# Patient Record
Sex: Female | Born: 2000 | Race: White | Hispanic: No | Marital: Single | Smoking: Never smoker
Health system: Southern US, Community
[De-identification: ages and names within clinical notes are randomized; demographics above are authoritative.]

## PROBLEM LIST (undated history)

## (undated) ENCOUNTER — Ambulatory Visit: Admission: EM | Source: Home / Self Care

## (undated) DIAGNOSIS — J45909 Unspecified asthma, uncomplicated: Secondary | ICD-10-CM

## (undated) DIAGNOSIS — F319 Bipolar disorder, unspecified: Secondary | ICD-10-CM

## (undated) HISTORY — PX: WISDOM TOOTH EXTRACTION: SHX21

## (undated) HISTORY — DX: Unspecified asthma, uncomplicated: J45.909

## (undated) HISTORY — DX: Bipolar disorder, unspecified: F31.9

## (undated) HISTORY — PX: TYMPANOSTOMY TUBE PLACEMENT: SHX32

---

## 2021-01-02 ENCOUNTER — Encounter: Payer: Self-pay | Admitting: *Deleted

## 2021-01-02 ENCOUNTER — Ambulatory Visit: Payer: 59

## 2021-01-02 ENCOUNTER — Other Ambulatory Visit: Payer: Self-pay | Admitting: *Deleted

## 2021-01-02 DIAGNOSIS — R002 Palpitations: Secondary | ICD-10-CM

## 2021-01-02 NOTE — Progress Notes (Signed)
Cardiology Office Note   Date:  01/03/2021   ID:  Berry, Gallacher 2000-05-28, MRN [id number]  PCP:  Wilfrid Lund, PA  Cardiologist:   Vilma Will [place], MD   Chief Complaint  Patient presents with   Palpitations      History of Present Illness: Dawn Snyder is a 20 y.o. female who is seen at the request of Horton Marshall PA for evaluation of tachycardia. She has a history of asthma and bipolar disorder. She uses an inhaler occasionally. Over the past 2 weeks she has noted that her heart rate will increase when she stands. With standing she will feel nauseated, shaky and hot. No dizziness. Her HR will go up to 120-140- max 150. If she rests her HR will gradually come down but may take 1.5-2 hours. No syncope. No chest pain. Not really associated with using her inhaler. No change in medications or medical history recently.  Past Medical History:  Diagnosis Date   Asthma    Bipolar disorder (HCC)     Past Surgical History:  Procedure Laterality Date   WISDOM TOOTH EXTRACTION Bilateral      Current Outpatient Medications  Medication Sig Dispense Refill   albuterol (VENTOLIN HFA) 108 (90 Base) MCG/ACT inhaler Inhale 1-2 puffs into the lungs every 4 (four) hours as needed.     ARIPiprazole (ABILIFY) 5 MG tablet Take 5 mg by mouth daily.     FLUoxetine (PROZAC) 10 MG capsule Take 10 mg by mouth daily.     Norgestimate-Ethinyl Estradiol Triphasic 0.18/0.215/0.25 MG-25 MCG tab Take 1 tablet by mouth daily.     No current facility-administered medications for this visit.    Allergies:   Lactase-lactobacillus and Soap & cleansers    Social History:  The patient  reports that she has never smoked. She has never been exposed to tobacco smoke. She has never used smokeless tobacco. She reports that she does not drink alcohol.   Family History:  The patient's family history includes Asthma in her brother and father; Fainting in her paternal grandmother; Heart attack in her paternal  grandmother; Heart disease in her paternal grandmother; Hyperlipidemia in her father; Hypertension in her paternal grandmother.    ROS:  Please see the history of present illness.   Otherwise, review of systems are positive for none.   All other systems are reviewed and negative.    PHYSICAL EXAM: VS:  BP 115/80 (BP Location: Right Arm)   Pulse 77   Ht 5\' 4"  (1.626 m)   Wt 161 lb 3.2 oz (73.1 kg)   SpO2 99%   BMI 27.67 kg/m  , BMI Body mass index is 27.67 kg/m. Orthostatic vitals checked without significant orthostasis.  GEN: Well nourished, well developed, in no acute distress HEENT: normal Neck: no JVD, carotid bruits, or masses Cardiac: RRR; no murmurs, rubs, or gallops,no edema  Respiratory:  clear to auscultation bilaterally, normal work of breathing GI: soft, nontender, nondistended, + BS MS: no deformity or atrophy Skin: warm and dry, no rash Neuro:  Strength and sensation are intact Psych: euthymic mood, full affect   EKG:  EKG is ordered today. The ekg ordered today demonstrates NSR rate 77. Normal. I have personally reviewed and interpreted this study.    Recent Labs: No results found for requested labs within last 8760 hours.   Labs dated 09/17/20: cholesterol 206, triglycerides 207, HDL 61, LDL 110, Hgb 13.9, creatinine 0.73, potassium 4.5. ALT 10, TSH 2.5. Plts  327K.  Lipid Panel No results found for: CHOL, TRIG, HDL, CHOLHDL, VLDL, LDLCALC, LDLDIRECT    Wt Readings from Last 3 Encounters:  01/03/21 161 lb 3.2 oz (73.1 kg) (88 %, Z= 1.16)*   * Growth percentiles are based on CDC (Girls, 2-20 Years) data.      Other studies Reviewed: Additional studies/ records that were reviewed today include: none. Review of the above records demonstrates: N/A   ASSESSMENT AND PLAN:  1.  Palpitations/tachycardia. Appears postural but orthostatics today are normal. Normal cardiac exam and Ecg. Recent labs unremarkable. Will arrange for a Zio patch monitor to assess  rhythm. Recommend regular aerobic activity and maintenance of good hydration.    Current medicines are reviewed at length with the patient today.  The patient does not have concerns regarding medicines.  The following changes have been made:  no change  Labs/ tests ordered today include:   Orders Placed This Encounter  Procedures   LONG TERM MONITOR (3-14 DAYS)   EKG 12-Lead      Disposition:   FU TBD  Signed, Tamon Parkerson [place], MD  01/03/2021 11:19 AM    [facility] [address] [place], [place], [address] Phone [phone], Fax [phone]

## 2021-01-02 NOTE — Progress Notes (Unsigned)
Patient enrolled for [employer] to mail a 1-3 day ZIO XT holter monitor to her address on file. Letter with instructions mailed to patient.

## 2021-01-03 ENCOUNTER — Encounter: Payer: Self-pay | Admitting: Cardiology

## 2021-01-03 ENCOUNTER — Other Ambulatory Visit: Payer: Self-pay | Admitting: *Deleted

## 2021-01-03 ENCOUNTER — Ambulatory Visit: Payer: 59

## 2021-01-03 ENCOUNTER — Ambulatory Visit (INDEPENDENT_AMBULATORY_CARE_PROVIDER_SITE_OTHER): Payer: 59

## 2021-01-03 ENCOUNTER — Other Ambulatory Visit: Payer: Self-pay

## 2021-01-03 ENCOUNTER — Ambulatory Visit: Payer: 59 | Admitting: Cardiology

## 2021-01-03 VITALS — BP 115/80 | HR 77 | Ht 64.0 in | Wt 161.2 lb

## 2021-01-03 DIAGNOSIS — J45909 Unspecified asthma, uncomplicated: Secondary | ICD-10-CM

## 2021-01-03 DIAGNOSIS — R002 Palpitations: Secondary | ICD-10-CM

## 2021-01-03 NOTE — Patient Instructions (Signed)
Medication Instructions:  No change   Lab Work: None ordered   Testing/Procedures: 2 week event monitor     Follow-Up: At [facility], you and your health needs are our priority.  As part of our continuing mission to provide you with exceptional heart care, we have created designated Provider Care Teams.  These Care Teams include your primary Cardiologist (physician) and Advanced Practice Providers (APPs -  Physician Assistants and Nurse Practitioners) who all work together to provide you with the care you need, when you need it.  We recommend signing up for the patient portal called "MyChart".  Sign up information is provided on this After Visit Summary.  MyChart is used to connect with patients for Virtual Visits (Telemedicine).  Patients are able to view lab/test results, encounter notes, upcoming appointments, etc.  Non-urgent messages can be sent to your provider as well.   To learn more about what you can do with MyChart, go to [email or web address].     Your next appointment:  To be determined after monitor    The format for your next appointment: Office   Provider:  Dr.Jordan

## 2021-01-03 NOTE — Progress Notes (Unsigned)
Patient enrolled for [employer] to mail a 14 day ZIO XT monitor to her address on file. Dr. Swaziland to read.  Note: order was orginally placed by Dr. Horton Marshall for 3 day ZIO XT on 9/29 and enrolled. Dr. Sw[place]and placed another order on 01/03/21 for a 14 day ZIO XT.   Dawn Snyder was contacted to link yesterdays monitor to 9/30 order and change amount of days to 14.

## 2021-01-06 ENCOUNTER — Telehealth: Payer: Self-pay

## 2021-01-06 DIAGNOSIS — R002 Palpitations: Secondary | ICD-10-CM | POA: Diagnosis not present

## 2021-01-06 NOTE — Telephone Encounter (Signed)
NOTES SCANNED TO REFERRAL RJ 

## 2021-01-17 ENCOUNTER — Telehealth: Payer: Self-pay | Admitting: Cardiology

## 2021-01-17 NOTE — Telephone Encounter (Signed)
Returned call to patient who states that she received a monitor in the mail that stated for her to wear it for 3 days, patient states she did and sent it back and then remembered that Dr. Swaz[place]and wanted her to wear it for 14 days. Patient would like to know what she should so since she only wore the monitor for 3 days before sending back. Patient states that she didn't see any of the MyChart messages so she wasn't aware. Advised patient I would forward to Dr. Sw[place]iland for him to review and advise. Patient verbalized understanding.

## 2021-01-17 NOTE — Telephone Encounter (Signed)
Spoke to patient Dr.Jordan's advice given. 

## 2021-01-17 NOTE — Telephone Encounter (Signed)
Pt is returning call for monitor results

## 2021-01-17 NOTE — Telephone Encounter (Signed)
Attempted to call patient, left message for patient to call back to office.   

## 2021-01-17 NOTE — Telephone Encounter (Signed)
Patient would like to know how long she needs to wear her heart monitor. Please advise.

## 2021-01-17 NOTE — Telephone Encounter (Signed)
I did review her monitor. She had multiple triggered events. She was in sinus rhythm throughout without arrhythmia. Triggered events were with sinus rhythm with rates from 90-130. Her HR is faster than normal at times but is normal. I think the length she wore monitor was adequate. Given findings I would reassure her. Sinus tachycardia is almost always related to noncardiac factors such as stress, medication effects or deconditioning. I would encourage healthy lifestyle with healthy diet and plenty of aerobic activity. I don't think additional testing is needed.   Chelsey Redondo [place] MD, [facility]

## 2021-03-05 ENCOUNTER — Ambulatory Visit: Payer: 59 | Admitting: Cardiovascular Disease

## 2021-09-10 ENCOUNTER — Encounter (HOSPITAL_COMMUNITY): Payer: Self-pay

## 2021-09-10 ENCOUNTER — Emergency Department (HOSPITAL_COMMUNITY): Payer: 59

## 2021-09-10 ENCOUNTER — Other Ambulatory Visit: Payer: Self-pay

## 2021-09-10 ENCOUNTER — Emergency Department (HOSPITAL_COMMUNITY)
Admission: EM | Admit: 2021-09-10 | Discharge: 2021-09-11 | Discharge status: Against Medical Advice | Payer: 59 | Attending: Emergency Medicine | Admitting: Emergency Medicine

## 2021-09-10 DIAGNOSIS — Z5321 Procedure and treatment not carried out due to patient leaving prior to being seen by health care provider: Secondary | ICD-10-CM | POA: Insufficient documentation

## 2021-09-10 DIAGNOSIS — R072 Precordial pain: Secondary | ICD-10-CM | POA: Diagnosis present

## 2021-09-10 DIAGNOSIS — R112 Nausea with vomiting, unspecified: Secondary | ICD-10-CM | POA: Diagnosis not present

## 2021-09-10 LAB — BASIC METABOLIC PANEL
Anion gap: 9 (ref 5–15)
BUN: 7 mg/dL (ref 6–20)
CO2: 22 mmol/L (ref 22–32)
Calcium: 9.7 mg/dL (ref 8.9–10.3)
Chloride: 107 mmol/L (ref 98–111)
Creatinine, Ser: 0.62 mg/dL (ref 0.44–1.00)
GFR, Estimated: >60 mL/min (ref >60)
Glucose, Bld: 114 mg/dL — ABNORMAL HIGH (ref 70–99)
Potassium: 4.4 mmol/L (ref 3.5–5.1)
Sodium: 138 mmol/L (ref 135–145)

## 2021-09-10 LAB — CBC
HCT: 43.3 % (ref 36.0–46.0)
Hemoglobin: 14 g/dL (ref 12.0–15.0)
MCH: 28.9 pg (ref 26.0–34.0)
MCHC: 32.3 g/dL (ref 30.0–36.0)
MCV: 89.5 fL (ref 80.0–100.0)
Platelets: 335 10*3/uL (ref 150–400)
RBC: 4.84 MIL/uL (ref 3.87–5.11)
RDW: 13.2 % (ref 11.5–15.5)
WBC: 8.5 10*3/uL (ref 4.0–10.5)
nRBC: 0 % (ref 0.0–0.2)

## 2021-09-10 LAB — TROPONIN I (HIGH SENSITIVITY)
Troponin I (High Sensitivity): 3 ng/L (ref <18)
Troponin I (High Sensitivity): 3 ng/L (ref <18)

## 2021-09-10 LAB — I-STAT BETA HCG BLOOD, ED (MC, WL, AP ONLY): I-stat hCG, quantitative: <5 m[IU]/mL (ref <5)

## 2021-09-10 LAB — D-DIMER, QUANTITATIVE: D-Dimer, Quant: 0.36 ug/mL-FEU (ref 0.00–0.50)

## 2021-09-10 NOTE — ED Triage Notes (Signed)
Left sided substernal chest pains since 5AM.

## 2021-09-10 NOTE — ED Provider Triage Note (Signed)
Emergency Medicine Provider Triage Evaluation Note  Dawn Snyder , a 21 y.o. female  was evaluated in triage.  Pt complains of chest pain.  Associated with nausea and 1 episode of emesis.  Worse with ambulation, endorses pleuritic pain.  She is on oral birth control, no recent surgery or travel.  Pain is been constant since 5 AM, substernal and sometimes moves to the left or the right..  Review of Systems  Per HPI Physical Exam  BP 123/76 (BP Location: Right Arm)   Pulse 80   Temp 98.1 F (36.7 C) (Oral)   Resp 16   SpO2 96%  Gen:   Awake, no distress   Resp:  Normal effort  MSK:   Moves extremities without difficulty  Other:    Medical Decision Making  Medically screening exam initiated at 6:39 PM.  Appropriate orders placed.  Dawn Snyder was informed that the remainder of the evaluation will be completed by another provider, this initial triage assessment does not replace that evaluation, and the importance of remaining in the ED until their evaluation is complete.     Sherrill Raring, PA-C 09/10/21 1839

## 2021-09-11 NOTE — Progress Notes (Signed)
NEW PATIENT Date of Service/Encounter:  09/12/21 Referring provider: Alyson Inglesostella, Vincent J, PA* Primary care provider: Wilfrid LundBecker, Anna G, PA  Subjective:  Dawn Snyder is a 21 y.o. female  presenting today for evaluation of chronic rhinitis and asthma. History obtained from: chart review and patient  Asthma: diagnosed around [age] years old Never been on a controller inhaler, only rescue inhaler as her asthma has never been really bad Triggers: pollen, exercise especially if cooler weather She does not use albuterol before she exercises.  Never hospitalized because of her asthma 1 ED visit due to asthma on 09/10/21-she left prior to being seen; she had been away at the beach and when she got home to her cat and dog she immediately couldn't breath, felt congested. Saw her PCP Monday and was given prednisone.  She felt hot while taking.  On Wednesday, she was having severe chest pain so she went to ED and had a normal EKG but left prior to being evaluated by MD/DO She did have to clean up after dog urinated on the floor last night, and again developed difficulty breathing. Can't take a deep breath in, coughing up a little bit of mucus. She has not had a fever.  She is having increased runny nose which has been intense since Sunday.  It's a slimy green mucusy color and sometimes clear.  She has significant facial pressure under and above her eyes.   Also has significant drainage going down back of her throat.  Her throat has been a little sore since Monday.  Chronic rhinitis:  Symptoms include: rhinorrhea, post nasal drainage, watery eyes, itchy eyes, and itchy nose  Occurs year-round Potential triggers: dogs and cats Treatments tried: benadryl PRN, zyrtec PRN Previous allergy testing: no History of reflux/heartburn: yes-takes TUMS a few times per month  Lactose intolerance:  Any dairy causes her to have stomach upset since she was in Middle school She has never tried lactose free  products.  She is able to eat butter without symptoms, but can not tolerate cheese, yogurt, and milk.  She will take lactaid pills when eating out which sometimes helps, but sometimes doesn't.  She gets stomach cramps, sweaty, and has to lay down until has to go to the bathroom and will have diarrhea.   She has sensitive skin and will get dry itchy skin if she uses the wrong soap.  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   When she touches her brother's dog which has wiry hair and she breaks out into hives.   Other allergy screening: Medication allergy: no Hymenoptera allergy: no Urticaria: no Eczema:no History of recurrent infections suggestive of immunodeficency: no Vaccinations are up to date.   Past Medical History: Past Medical History:  Diagnosis Date   Asthma    Bipolar disorder (HCC)    Medication List:  Current Outpatient Medications  Medication Sig Dispense Refill   albuterol (VENTOLIN HFA) 108 (90 Base) MCG/ACT inhaler Inhale 1-2 puffs into the lungs every 4 (four) hours as needed.     ARIPiprazole (ABILIFY) 5 MG tablet Take 5 mg by mouth daily.     FLUoxetine (PROZAC) 20 MG capsule Take 20 mg by mouth daily.     Norgestimate-Ethinyl Estradiol Triphasic 0.18/0.215/0.25 MG-25 MCG tab Take 1 tablet by mouth daily.     No current facility-administered medications for this visit.   Known Allergies:  Allergies  Allergen Reactions   Lactose Intolerance (Gi) Other (See Comments)  Soap & Cleansers Itching   Past Surgical History: Past Surgical History:  Procedure Laterality Date   TYMPANOSTOMY TUBE PLACEMENT     1st grade   WISDOM TOOTH EXTRACTION Bilateral    Family History: Family History  Problem Relation Age of Onset   Allergic rhinitis Mother    Allergic rhinitis Father    Hyperlipidemia Father    Asthma Father    Asthma Brother    Hypertension Paternal Grandmother    Fainting Paternal Grandmother    Heart disease Paternal  Grandmother    Heart attack Paternal Grandmother    Social History: Jahniya lives in an apartment built 5 years ago, no water damage, carpet floors, electric heating, central AC, pets: Dogs, cats, reptiles, hamster.  Dogs and cat are in her bedroom.  Using dust mite protections on her bedding and pillows.  She works as a Teaching laboratory technician.  No HEPA filter in the home.  [place]  No previous smoking history.   ROS:  All other systems negative except as noted per HPI.  Objective:  Blood pressure 106/70, pulse 84, temperature 98 F (36.7 C), temperature source Temporal, resp. rate (!) 22, height 5\' 5"  (1.651 m), weight 180 lb (81.6 kg), SpO2 98 %. Body mass index is 29.95 kg/m. Physical Exam:  General Appearance:  Alert, cooperative, no distress, appears stated age  Head:  Normocephalic, without obvious abnormality, atraumatic  Eyes:  Conjunctiva clear, EOM's intact  Nose: Nares normal,  thick purulent green mucus in right nostril, hypertrophic turbinates, and no visible anterior polyps  Throat: Lips, tongue normal; teeth and gums normal, no tonsillar exudate and + cobblestoning  Neck: Supple, symmetrical  Lungs:   clear to auscultation bilaterally, Respirations unlabored, no coughing  Heart:  regular rate and rhythm and no murmur, Appears well perfused  Extremities: No edema  Skin: Skin color, texture, turgor normal, no rashes or lesions on visualized portions of skin  Neurologic: No gross deficits     Diagnostics: Spirometry:  Tracings reviewed. Her effort: Variable effort-results affected. FVC: 2.88L  FEV1: 2.39L, 69% predicted  FEV1/FVC ratio: 94%  Interpretation: Spirometry consistent with possible restrictive disease   Skin Testing: Deferred due to recent antihistamines use.  Assessment and Plan  Mrs. Kasparek is a 21 year old with chronic rhinitis and intermittent asthma who presents today for evaluation following recent flare of asthma  in the setting of cat and dog exposure.  Her exam and history are concerning for acute bacterial sinusitis which we treated today.  Suspect that a large part of her cough is coming from upper airway inflammation from drainage.No wheezing on exam today, but did provide her with an inhaler to use once daily for the next 2 weeks during current illness.   Her history is concerning for allergic rhinitis, but we will hold on testing until she feels better and is off antihistamines.   Her asthma had been controlled until recent illness/allergen exposure.  She is around cat and dog at her home all the time, but had been away on vacation and on reexposure developed symptoms.  She also develops hives which I was able to confirm with pictures reviewed on her phone.  Her hives develop anytime she touches her brother's dog.  She would likely benefit from allergy injections, but is unsure if insurance will cover this for her.  We discussed medical management if that were the case.  She will return for allergy testing when it is convenient for her.  Acute bacterial sinus infections:  - Augmentin 875 mg take twice daily for 10 days Take with probiotics to prevent yeast infections or upset stomach  Dairy intolerance - suspect lactose intolerance, but will test for milk when you return for follow-up.  Chronic Rhinitis: - allergy testing today was deferred due to sinus infection and recent antihistamine use, will test at follow-up visit. - discuss allergy injections pending allergy tests - Start Nasal Steroid Spray: Options include Flonase (fluticasone), Nasocort (triamcinolone), Nasonex (mometasome) 1- 2 sprays in each nostril daily (can buy over-the-counter if not covered by insurance)  Best results if used daily. - Start a non-drowsy over the counter antihistamine daily or daily as needed.   -Your options include Zyrtec (Cetirizine) 10mg , Claritin (Loratadine) 10mg , Allegra (Fexofenadine) 180mg , or Xyzal  (Levocetirinze) 5mg   Allergic Conjunctivitis:  - Start Allergy Eye drops: great options include Pataday (Olopatadine) or Zaditor (ketotifen) for eye symptoms daily as needed-both sold over the counter if not covered by insurance.   -Avoid eye drops that say red eye relief as they may contain medications that dry out your eyes.  Intermittent Asthma w/ Flare: - your lung testing today looked okay - Controller Inhaler-Start now and use for 2 weeks or until symptoms resolve: Start Breo 100 1 puff once a day; Use In Block Therapy-Start if having respiratory symptoms.  Use for at least 1-2 weeks or until symptoms resolve. - Rinse mouth out after use - Rescue Inhaler: Albuterol (Proair/Ventolin) 2 puffs . Use  every 4-6 hours as needed for chest tightness, wheezing, or coughing.  Can also use 15 minutes prior to exercise if you have symptoms with activity. - Asthma is not controlled if:  - Symptoms are occurring >2 times a week OR  - >2 times a month nighttime awakenings  - You are requiring systemic steroids (prednisone/steroid injections) more than once per year  - Your require hospitalization for your asthma.  - Please call the clinic to schedule a follow up if these symptoms arise   Follow-up for allergy testing when you feel better! Must stop all antihistamines e days prior to this appointment. It was a pleasure meeting you today!  , MD [facility]   This note in its entirety was forwarded to the Provider who requested this consultation.  Thank you for your kind referral. I appreciate the opportunity to take part in Yatziri's care. Please do not hesitate to contact me with questions.  Sincerely,  , MD [facility]

## 2021-09-11 NOTE — ED Provider Notes (Signed)
Patient did not answer when called for room. I never evaluated this patient.   This chart was dictated using voice recognition software, Dragon. Despite the best efforts of this provider to proofread and correct errors, errors may still occur which can change documentation meaning.    Paris Lore, PA-C 09/11/21 2130    Sabas Sous, MD 09/12/21 [phone]

## 2021-09-12 ENCOUNTER — Ambulatory Visit: Payer: 59 | Admitting: Internal Medicine

## 2021-09-12 ENCOUNTER — Encounter: Payer: Self-pay | Admitting: Internal Medicine

## 2021-09-12 VITALS — BP 106/70 | HR 84 | Temp 98.0°F | Resp 22 | Ht 65.0 in | Wt 180.0 lb

## 2021-09-12 DIAGNOSIS — H1013 Acute atopic conjunctivitis, bilateral: Secondary | ICD-10-CM | POA: Diagnosis not present

## 2021-09-12 DIAGNOSIS — J3081 Allergic rhinitis due to animal (cat) (dog) hair and dander: Secondary | ICD-10-CM | POA: Diagnosis not present

## 2021-09-12 DIAGNOSIS — L2381 Allergic contact dermatitis due to animal (cat) (dog) dander: Secondary | ICD-10-CM

## 2021-09-12 DIAGNOSIS — J310 Chronic rhinitis: Secondary | ICD-10-CM

## 2021-09-12 DIAGNOSIS — J4520 Mild intermittent asthma, uncomplicated: Secondary | ICD-10-CM | POA: Diagnosis not present

## 2021-09-12 DIAGNOSIS — J4521 Mild intermittent asthma with (acute) exacerbation: Secondary | ICD-10-CM | POA: Insufficient documentation

## 2021-09-12 DIAGNOSIS — J0100 Acute maxillary sinusitis, unspecified: Secondary | ICD-10-CM

## 2021-09-12 MED ORDER — AMOXICILLIN-POT CLAVULANATE 875-125 MG PO TABS
1.0000 | ORAL_TABLET | Freq: Two times a day (BID) | ORAL | 0 refills | Status: DC
Start: 2021-09-12 — End: 2021-12-04

## 2021-09-12 MED ORDER — ALBUTEROL SULFATE HFA 108 (90 BASE) MCG/ACT IN AERS: 2.0000 | INHALATION_SPRAY | Freq: Four times a day (QID) | RESPIRATORY_TRACT | 2 refills | Status: AC | PRN

## 2021-09-12 MED ORDER — FLUTICASONE FUROATE-VILANTEROL 100-25 MCG/ACT IN AEPB
1.0000 | INHALATION_SPRAY | Freq: Every day | RESPIRATORY_TRACT | 2 refills | Status: DC
Start: 2021-09-12 — End: 2023-10-26

## 2021-09-12 NOTE — Patient Instructions (Signed)
Acute bacterial sinus infections:  - Augmentin 875 mg take twice daily for 10 days Take with probiotics to prevent yeast infections or upset stomach  Dairy intolerance - suspect lactose intolerance, but will test for milk when you return for follow-up.  Chronic Rhinitis: - allergy testing today was deferred due to sinus infection and recent antihistamine use, will test at follow-up visit. - discuss allergy injections pending allergy tests - Start Nasal Steroid Spray: Options include Flonase (fluticasone), Nasocort (triamcinolone), Nasonex (mometasome) 1- 2 sprays in each nostril daily (can buy over-the-counter if not covered by insurance)  Best results if used daily. - Start a non-drowsy over the counter antihistamine daily or daily as needed.   -Your options include Zyrtec (Cetirizine) 10mg , Claritin (Loratadine) 10mg , Allegra (Fexofenadine) 180mg , or Xyzal (Levocetirinze) 5mg   Allergic Conjunctivitis:  - Start Allergy Eye drops: great options include Pataday (Olopatadine) or Zaditor (ketotifen) for eye symptoms daily as needed-both sold over the counter if not covered by insurance.   -Avoid eye drops that say red eye relief as they may contain medications that dry out your eyes.  Intermittent Asthma w/ Flare: - your lung testing today looked okay - Controller Inhaler-Start now and use for 2 weeks or until symptoms resolve: Start Breo 100 1 puff once a day; Use In Block Therapy-Start if having respiratory symptoms.  Use for at least 1-2 weeks or until symptoms resolve. - Rinse mouth out after use - Rescue Inhaler: Albuterol (Proair/Ventolin) 2 puffs . Use  every 4-6 hours as needed for chest tightness, wheezing, or coughing.  Can also use 15 minutes prior to exercise if you have symptoms with activity. - Asthma is not controlled if:  - Symptoms are occurring >2 times a week OR  - >2 times a month nighttime awakenings  - You are requiring systemic steroids (prednisone/steroid injections)  more than once per year  - Your require hospitalization for your asthma.  - Please call the clinic to schedule a follow up if these symptoms arise   Follow-up for allergy testing when you feel better! Must stop all antihistamines e days prior to this appointment. It was a pleasure meeting you today!  , MD [facility]

## 2021-09-24 NOTE — Patient Instructions (Signed)
Asthma Continue Breo Ellipta 100-1 puff once a day to prevent cough or wheeze Continue albuterol 2 puffs once every 4 hours as needed for cough or wheeze You may use albuterol 2 puffs 5-15 minutes before activity to decrease cough or wheeze  Allergic rhinitis Your environmental allergy testing was positive to cat, dog, and dust mite, and borderline positive to cockroach Allergen avoidance measures are listed below Begin levocetirizine 5 mg once a day as needed for a runny nose or itch. Remember to rotate to a different antihistamine about every 3 months. Some examples of over the counter antihistamines include Zyrtec (cetirizine), Xyzal (levocetirizine), Allegra (fexofenadine), and Claritin (loratidine).  Continue fluticasone nasal spray 2 sprays in each nostril once a day as needed for a stuffy nose Continue saline nasal rinses as needed for nasal symptoms. Use this before any medicated nasal sprays for best result Consider allergen immunotherapy if your symptoms are not well controlled with the treatment plan as listed above.  Call the clinic and set up an appointment for your first allergy injection if you are interested in this option  Allergic conjunctivitis Some over the counter eye drops include Pataday one drop in each eye once a day as needed for red, itchy eyes OR Zaditor one drop in each eye twice a day as needed for red itchy eyes.  Allergic urticaria Begin levocetirizine 5 mg once a day as needed for itch.  You may take an additional levocetirizine 5 mg once a day for breakthrough symptoms  Food allergy/Lactose intolerance Your food allergy skin testing to milk and casein was negative.  We have ordered a lab test to help [place] evaluate your possible food allergy.  We will call you when the result becomes available.  Call the clinic if this treatment plan is not working well for you  Follow up in 2 months or sooner if needed.  Control of Dog or Cat Allergen Avoidance is the best  way to manage a dog or cat allergy. If you have a dog or cat and are allergic to dog or cats, consider removing the dog or cat from the home. If you have a dog or cat but don't want to find it a new home, or if your family wants a pet even though someone in the household is allergic, here are some strategies that may help keep symptoms at bay:  Keep the pet out of your bedroom and restrict it to only a few rooms. Be advised that keeping the dog or cat in only one room will not limit the allergens to that room. Don't pet, hug or kiss the dog or cat; if you do, wash your hands with soap and water. High-efficiency particulate air (HEPA) cleaners run continuously in a bedroom or living room can reduce allergen levels over time. Regular use of a high-efficiency vacuum cleaner or a central vacuum can reduce allergen levels. Giving your dog or cat a bath at least once a week can reduce airborne allergen.   Control of Dust Mite Allergen Dust mites play a major role in allergic asthma and rhinitis. They occur in environments with high humidity wherever human skin is found. Dust mites absorb humidity from the atmosphere (ie, they do not drink) and feed on organic matter (including shed human and animal skin). Dust mites are a microscopic type of insect that you cannot see with the naked eye. High levels of dust mites have been detected from mattresses, pillows, carpets, upholstered furniture, bed covers, clothes, soft toys  and any woven material. The principal allergen of the dust mite is found in its feces. A gram of dust may contain 1,000 mites and 250,000 fecal particles. Mite antigen is easily measured in the air during house cleaning activities. Dust mites do not bite and do not cause harm to humans, other than by triggering allergies/asthma.  Ways to decrease your exposure to dust mites in your home:  1. Encase mattresses, box springs and pillows with a mite-impermeable barrier or cover  2. Wash sheets,  blankets and drapes weekly in hot water (130 F) with detergent and dry them in a dryer on the hot setting.  3. Have the room cleaned frequently with a vacuum cleaner and a damp dust-mop. For carpeting or rugs, vacuuming with a vacuum cleaner equipped with a high-efficiency particulate air (HEPA) filter. The dust mite allergic individual should not be in a room which is being cleaned and should wait 1 hour after cleaning before going into the room.  4. Do not sleep on upholstered furniture (eg, couches).  5. If possible removing carpeting, upholstered furniture and drapery from the home is ideal. Horizontal blinds should be eliminated in the rooms where the person spends the most time (bedroom, study, television room). Washable vinyl, roller-type shades are optimal.  6. Remove all non-washable stuffed toys from the bedroom. Wash stuffed toys weekly like sheets and blankets above.  7. Reduce indoor humidity to less than 50%. Inexpensive humidity monitors can be purchased at most hardware stores. Do not use a humidifier as can make the problem worse and are not recommended.  Control of Cockroach Allergen Cockroach allergen has been identified as an important cause of acute attacks of asthma, especially in urban settings.  There are fifty-five species of cockroach that exist in the [place], however only three, the [place], [place] species produce allergen that can affect patients with Asthma.  Allergens can be obtained from fecal particles, egg casings and secretions from cockroaches.    Remove food sources. Reduce access to water. Seal access and entry points. Spray runways with 0.5-1% Diazinon or Chlorpyrifos Blow boric acid power under stoves and refrigerator. Place bait stations (hydramethylnon) at feeding sites.

## 2021-09-24 NOTE — Progress Notes (Unsigned)
   [address] [place] [address] Dept: [phone]  FOLLOW UP NOTE  Patient ID: Dawn Snyder, female    DOB: [birth year]  Age: 21 y.o. MRN: [id number] Date of Office Visit: 09/25/2021  Assessment  Chief Complaint: No chief complaint on file.  HPI Dawn Snyder is a 21 year old female who presents to the clinic for a follow up visit. She was last seen in this clinic on 09/12/2021 by Dr. Maurine Minister for evaluation of asthma, chronic rhinitoconjunctivitis, lactose intolerance, and acute bacterial sinusitis requiring Augmentin for resolution of symptoms.    Drug Allergies:  Allergies  Allergen Reactions   Lactose Intolerance (Gi) Other (See Comments)   Soap & Cleansers Itching    Physical Exam: There were no vitals taken for this visit.   Physical Exam  Diagnostics:    Assessment and Plan: No diagnosis found.  No orders of the defined types were placed in this encounter.   There are no Patient Instructions on file for this visit.  No follow-ups on file.    Thank you for the opportunity to care for this patient.  Please do not hesitate to contact me with questions.  Thermon Leyland, FNP [facility]

## 2021-09-25 ENCOUNTER — Ambulatory Visit: Payer: 59 | Admitting: Family Medicine

## 2021-09-25 ENCOUNTER — Encounter: Payer: Self-pay | Admitting: Family Medicine

## 2021-09-25 VITALS — BP 102/64 | HR 85 | Temp 97.9°F | Resp 18

## 2021-09-25 DIAGNOSIS — K9049 Malabsorption due to intolerance, not elsewhere classified: Secondary | ICD-10-CM

## 2021-09-25 DIAGNOSIS — J3089 Other allergic rhinitis: Secondary | ICD-10-CM

## 2021-09-25 DIAGNOSIS — H1013 Acute atopic conjunctivitis, bilateral: Secondary | ICD-10-CM | POA: Diagnosis not present

## 2021-09-25 DIAGNOSIS — J4540 Moderate persistent asthma, uncomplicated: Secondary | ICD-10-CM

## 2021-09-25 DIAGNOSIS — L500 Allergic urticaria: Secondary | ICD-10-CM | POA: Diagnosis not present

## 2021-09-25 MED ORDER — LEVOCETIRIZINE DIHYDROCHLORIDE 5 MG PO TABS: 5.0000 mg | ORAL_TABLET | Freq: Every evening | ORAL | 5 refills | Status: AC

## 2021-11-26 ENCOUNTER — Ambulatory Visit: Payer: 59 | Admitting: Family Medicine

## 2021-12-04 ENCOUNTER — Encounter: Payer: Self-pay | Admitting: Family Medicine

## 2021-12-04 ENCOUNTER — Ambulatory Visit: Payer: 59 | Admitting: Family Medicine

## 2021-12-04 VITALS — BP 116/82 | HR 75 | Temp 98.2°F | Resp 17

## 2021-12-04 DIAGNOSIS — J3089 Other allergic rhinitis: Secondary | ICD-10-CM | POA: Diagnosis not present

## 2021-12-04 DIAGNOSIS — L500 Allergic urticaria: Secondary | ICD-10-CM

## 2021-12-04 DIAGNOSIS — H1013 Acute atopic conjunctivitis, bilateral: Secondary | ICD-10-CM

## 2021-12-04 DIAGNOSIS — J302 Other seasonal allergic rhinitis: Secondary | ICD-10-CM | POA: Insufficient documentation

## 2021-12-04 DIAGNOSIS — J4540 Moderate persistent asthma, uncomplicated: Secondary | ICD-10-CM | POA: Diagnosis not present

## 2021-12-04 DIAGNOSIS — K9049 Malabsorption due to intolerance, not elsewhere classified: Secondary | ICD-10-CM

## 2021-12-04 MED ORDER — MONTELUKAST SODIUM 10 MG PO TABS: 10.0000 mg | ORAL_TABLET | Freq: Every day | ORAL | 5 refills | Status: AC

## 2021-12-04 NOTE — Progress Notes (Signed)
[address] [place] [address] Dept: [phone]  FOLLOW UP NOTE  Patient ID: Dawn Snyder, female    DOB: 06-18-2000  Age: 21 y.o. MRN: [id number] Date of Office Visit: 12/04/2021  Assessment  Chief Complaint: Asthma and Follow-up (Pt states that she had to dog sit her bro dogs 2 weeks ago, and since she's develop this cough, congestion, stuffy nose with clear mucus that just won't go away.)  HPI Dawn Snyder is a 21 year old female who presents to the clinic for follow-up visit.  She was last seen in this clinic on 09/25/2021 by Thermon Leyland, FNP, for evaluation of asthma, allergic rhinitis, allergic conjunctivitis, allergic urticaria, and food intolerance.  In the interim, she reports that she has been babysitting her family's dog and cat for the last 2 weekends in a row after which she developed symptoms of allergic rhinitis including clear rhinorrhea, nasal congestion, sneezing, and copious postnasal drainage with frequent throat clearing and cough producing clear thick mucus.  At today's visit, she reports her asthma has been moderately well controlled with occasional shortness of breath with activity due to nasal congestion and frequent cough producing mucus ranging in color from cream to clear.  She continues Breo 100-1 puff once a day and has used albuterol 1 time since her last visit to this clinic with relief of symptoms.  Allergic rhinitis is reported as poorly controlled especially over the last 2 weeks with symptoms including clear thin rhinorrhea, nasal congestion, intermittent headache, itching in her ears, sneezing, and postnasal drainage with frequent throat clearing and cough.  She continues Claritin 10 mg once a day, Benadryl as needed, and Flonase nasal spray.  Her last environmental allergy testing was on 09/25/2021 and was positive to cat, dog, dust mite, and borderline positive to cockroach.  She is interested in allergen immunotherapy, however, she believes this is not  covered under her current insurance plan.  Her last food allergy skin testing was on 09/25/2021 and was negative to milk and casein.  Allergic conjunctivitis is reported as moderately well controlled with occasional red and itchy eyes for which she uses olopatadine as well as a lubricating eyedrop with relief of symptoms.  Urticaria is reported as moderately well controlled with symptoms occurring only when she touches her family's dog.  Symptoms resolve quickly with Benadryl and washing her hands.  She continues to consume lactose-free dairy products with no adverse reaction.  She does report abdominal pain and diarrhea when consuming dairy products containing lactose.  Her current medications are listed in the chart.   Drug Allergies:  Allergies  Allergen Reactions   Lactose Intolerance (Gi) Other (See Comments)   Soap & Cleansers Itching    Physical Exam: BP 116/82   Pulse 75   Temp 98.2 F (36.8 C) (Temporal)   Resp 17   SpO2 98%    Physical Exam Vitals reviewed.  Constitutional:      Appearance: Normal appearance.  HENT:     Head: Normocephalic and atraumatic.     Nose:     Comments: Bilateral nares slightly erythematous with clear nasal drainage noted.  Pharynx slightly erythematous with no exudate.  Bilateral ears with clear effusion.  Ear canals normal.  Eyes normal. Eyes:     Conjunctiva/sclera: Conjunctivae normal.  Cardiovascular:     Rate and Rhythm: Normal rate and regular rhythm.     Heart sounds: Normal heart sounds. No murmur heard. Pulmonary:     Effort: Pulmonary effort is  normal.     Breath sounds: Normal breath sounds.     Comments: Lungs clear to auscultation Musculoskeletal:        General: Normal range of motion.     Cervical back: Normal range of motion and neck supple.  Skin:    General: Skin is warm and dry.  Neurological:     Mental Status: She is alert and oriented to person, place, and time.  Psychiatric:        Mood and Affect: Mood normal.         Behavior: Behavior normal.        Thought Content: Thought content normal.        Judgment: Judgment normal.     Diagnostics: FVC 3.13, FEV1 2.50.  Predicted FVC 3.94, predicted FEV1 3.44.  Spirometry indicates possible restriction.  This is consistent with previous spirometry readings.  Assessment and Plan: 1. Moderate persistent asthma without complication   2. Allergic conjunctivitis of both eyes   3. Seasonal and perennial allergic rhinitis   4. Allergic urticaria   5. Food intolerance     Meds ordered this encounter  Medications   montelukast (SINGULAIR) 10 MG tablet    Sig: Take 1 tablet (10 mg total) by mouth at bedtime.    Dispense:  30 tablet    Refill:  5    Patient Instructions  Asthma Begin montelukast 10 mg once a day to prevent cough or wheeze. Patient cautioned that rarely some children/adults can experience behavioral changes after beginning montelukast. These side effects are rare, however, if you notice any change, notify the clinic and discontinue montelukast. Continue Breo Ellipta 100-1 puff once a day to prevent cough or wheeze Continue albuterol 2 puffs once every 4 hours as needed for cough or wheeze You may use albuterol 2 puffs 5-15 minutes before activity to decrease cough or wheeze  Allergic rhinitis Continue allergen avoidance measures directed toward pets, dust mite, and cockroach as listed below Begin montelukast 10 mg once a day as listed above For thick postnasal drainage, begin Mucinex 600 mg to 1200 mg twice a day Continue Claritin 10 mg once a day as needed for a runny nose or itch. Remember to rotate to a different antihistamine about every 3 months. Some examples of over the counter antihistamines include Zyrtec (cetirizine), Xyzal (levocetirizine), Allegra (fexofenadine), and Claritin (loratidine).  Continue fluticasone nasal spray 2 sprays in each nostril once a day as needed for a stuffy nose Continue saline nasal rinses as needed for  nasal symptoms. Use this before any medicated nasal sprays for best result Consider allergen immunotherapy if your symptoms are not well controlled with the treatment plan as listed above.  Call the clinic and set up an appointment for your first allergy injection if you are interested in this option.  Written information provided at today's visit  Allergic conjunctivitis Some over the counter eye drops include Pataday one drop in each eye once a day as needed for red, itchy eyes OR Zaditor one drop in each eye twice a day as needed for red itchy eyes.  Allergic urticaria Continue Claritin 10 mg once a day as needed for itch.  You may take an additional Claritin 10 mg once a day for breakthrough symptoms  Food allergy/Lactose intolerance At your last visit, your food allergy skin testing to milk and casein was negative.  We have ordered a lab test to help [place] evaluate your possible food allergy.  We will call you when the  result becomes available.  Call the clinic if your symptoms worsen, do not improve, or if you develop a fever  Follow up in 3 months or sooner if needed.   Return in about 3 months (around 03/05/2022), or if symptoms worsen or fail to improve.    Thank you for the opportunity to care for this patient.  Please do not hesitate to contact me with questions.  Thermon Leyland, FNP [facility]

## 2021-12-04 NOTE — Patient Instructions (Addendum)
Asthma Begin montelukast 10 mg once a day to prevent cough or wheeze. Patient cautioned that rarely some children/adults can experience behavioral changes after beginning montelukast. These side effects are rare, however, if you notice any change, notify the clinic and discontinue montelukast. Continue Breo Ellipta 100-1 puff once a day to prevent cough or wheeze Continue albuterol 2 puffs once every 4 hours as needed for cough or wheeze You may use albuterol 2 puffs 5-15 minutes before activity to decrease cough or wheeze  Allergic rhinitis Continue allergen avoidance measures directed toward pets, dust mite, and cockroach as listed below Begin montelukast 10 mg once a day as listed above For thick postnasal drainage, begin Mucinex 600 mg to 1200 mg twice a day Continue Claritin 10 mg once a day as needed for a runny nose or itch. Remember to rotate to a different antihistamine about every 3 months. Some examples of over the counter antihistamines include Zyrtec (cetirizine), Xyzal (levocetirizine), Allegra (fexofenadine), and Claritin (loratidine).  Continue fluticasone nasal spray 2 sprays in each nostril once a day as needed for a stuffy nose Continue saline nasal rinses as needed for nasal symptoms. Use this before any medicated nasal sprays for best result Consider allergen immunotherapy if your symptoms are not well controlled with the treatment plan as listed above.  Call the clinic and set up an appointment for your first allergy injection if you are interested in this option.  Written information provided at today's visit  Allergic conjunctivitis Some over the counter eye drops include Pataday one drop in each eye once a day as needed for red, itchy eyes OR Zaditor one drop in each eye twice a day as needed for red itchy eyes.  Allergic urticaria Continue Claritin 10 mg once a day as needed for itch.  You may take an additional Claritin 10 mg once a day for breakthrough symptoms  Food  allergy/Lactose intolerance At your last visit, your food allergy skin testing to milk and casein was negative.  We have ordered a lab test to help [place] evaluate your possible food allergy.  We will call you when the result becomes available.  Call the clinic if your symptoms worsen, do not improve, or if you develop a fever  Follow up in 3 months or sooner if needed.  Control of Dog or Cat Allergen Avoidance is the best way to manage a dog or cat allergy. If you have a dog or cat and are allergic to dog or cats, consider removing the dog or cat from the home. If you have a dog or cat but don't want to find it a new home, or if your family wants a pet even though someone in the household is allergic, here are some strategies that may help keep symptoms at bay:  Keep the pet out of your bedroom and restrict it to only a few rooms. Be advised that keeping the dog or cat in only one room will not limit the allergens to that room. Don't pet, hug or kiss the dog or cat; if you do, wash your hands with soap and water. High-efficiency particulate air (HEPA) cleaners run continuously in a bedroom or living room can reduce allergen levels over time. Regular use of a high-efficiency vacuum cleaner or a central vacuum can reduce allergen levels. Giving your dog or cat a bath at least once a week can reduce airborne allergen.   Control of Dust Mite Allergen Dust mites play a major role in allergic asthma and  rhinitis. They occur in environments with high humidity wherever human skin is found. Dust mites absorb humidity from the atmosphere (ie, they do not drink) and feed on organic matter (including shed human and animal skin). Dust mites are a microscopic type of insect that you cannot see with the naked eye. High levels of dust mites have been detected from mattresses, pillows, carpets, upholstered furniture, bed covers, clothes, soft toys and any woven material. The principal allergen of the dust mite is  found in its feces. A gram of dust may contain 1,000 mites and 250,000 fecal particles. Mite antigen is easily measured in the air during house cleaning activities. Dust mites do not bite and do not cause harm to humans, other than by triggering allergies/asthma.  Ways to decrease your exposure to dust mites in your home:  1. Encase mattresses, box springs and pillows with a mite-impermeable barrier or cover  2. Wash sheets, blankets and drapes weekly in hot water (130 F) with detergent and dry them in a dryer on the hot setting.  3. Have the room cleaned frequently with a vacuum cleaner and a damp dust-mop. For carpeting or rugs, vacuuming with a vacuum cleaner equipped with a high-efficiency particulate air (HEPA) filter. The dust mite allergic individual should not be in a room which is being cleaned and should wait 1 hour after cleaning before going into the room.  4. Do not sleep on upholstered furniture (eg, couches).  5. If possible removing carpeting, upholstered furniture and drapery from the home is ideal. Horizontal blinds should be eliminated in the rooms where the person spends the most time (bedroom, study, television room). Washable vinyl, roller-type shades are optimal.  6. Remove all non-washable stuffed toys from the bedroom. Wash stuffed toys weekly like sheets and blankets above.  7. Reduce indoor humidity to less than 50%. Inexpensive humidity monitors can be purchased at most hardware stores. Do not use a humidifier as can make the problem worse and are not recommended.  Control of Cockroach Allergen Cockroach allergen has been identified as an important cause of acute attacks of asthma, especially in urban settings.  There are fifty-five species of cockroach that exist in the [place], however only three, the [place], [place] species produce allergen that can affect patients with Asthma.  Allergens can be obtained from fecal particles, egg casings and  secretions from cockroaches.    Remove food sources. Reduce access to water. Seal access and entry points. Spray runways with 0.5-1% Diazinon or Chlorpyrifos Blow boric acid power under stoves and refrigerator. Place bait stations (hydramethylnon) at feeding sites.

## 2022-03-05 ENCOUNTER — Ambulatory Visit: Payer: 59 | Admitting: Family Medicine

## 2022-04-29 DIAGNOSIS — J111 Influenza due to unidentified influenza virus with other respiratory manifestations: Secondary | ICD-10-CM | POA: Diagnosis not present

## 2022-04-29 DIAGNOSIS — J029 Acute pharyngitis, unspecified: Secondary | ICD-10-CM | POA: Diagnosis not present

## 2022-04-29 DIAGNOSIS — Z03818 Encounter for observation for suspected exposure to other biological agents ruled out: Secondary | ICD-10-CM | POA: Diagnosis not present

## 2022-04-29 DIAGNOSIS — R051 Acute cough: Secondary | ICD-10-CM | POA: Diagnosis not present

## 2022-04-29 DIAGNOSIS — R52 Pain, unspecified: Secondary | ICD-10-CM | POA: Diagnosis not present

## 2022-06-22 DIAGNOSIS — L723 Sebaceous cyst: Secondary | ICD-10-CM | POA: Diagnosis not present

## 2022-07-07 DIAGNOSIS — F3181 Bipolar II disorder: Secondary | ICD-10-CM | POA: Diagnosis not present

## 2022-07-07 DIAGNOSIS — F419 Anxiety disorder, unspecified: Secondary | ICD-10-CM | POA: Diagnosis not present

## 2022-07-13 DIAGNOSIS — F1190 Opioid use, unspecified, uncomplicated: Secondary | ICD-10-CM | POA: Diagnosis not present

## 2022-07-13 DIAGNOSIS — Z79899 Other long term (current) drug therapy: Secondary | ICD-10-CM | POA: Diagnosis not present

## 2022-07-13 DIAGNOSIS — F338 Other recurrent depressive disorders: Secondary | ICD-10-CM | POA: Diagnosis not present

## 2022-07-13 DIAGNOSIS — Z1331 Encounter for screening for depression: Secondary | ICD-10-CM | POA: Diagnosis not present

## 2022-07-13 DIAGNOSIS — F411 Generalized anxiety disorder: Secondary | ICD-10-CM | POA: Diagnosis not present

## 2022-07-14 ENCOUNTER — Institutional Professional Consult (permissible substitution): Payer: 59 | Admitting: Plastic Surgery

## 2022-08-04 DIAGNOSIS — F411 Generalized anxiety disorder: Secondary | ICD-10-CM | POA: Diagnosis not present

## 2022-08-04 DIAGNOSIS — F338 Other recurrent depressive disorders: Secondary | ICD-10-CM | POA: Diagnosis not present

## 2022-08-20 DIAGNOSIS — F419 Anxiety disorder, unspecified: Secondary | ICD-10-CM | POA: Diagnosis not present

## 2022-08-20 DIAGNOSIS — F338 Other recurrent depressive disorders: Secondary | ICD-10-CM | POA: Diagnosis not present

## 2022-08-20 DIAGNOSIS — F3181 Bipolar II disorder: Secondary | ICD-10-CM | POA: Diagnosis not present

## 2022-09-26 IMAGING — CR DG CHEST 2V
2 series · 2 of 2 positions shown · non-contrast
Comparison: None Available.

CLINICAL DATA: Chest pain.

EXAM:
CHEST - 2 VIEW

[chest pa]
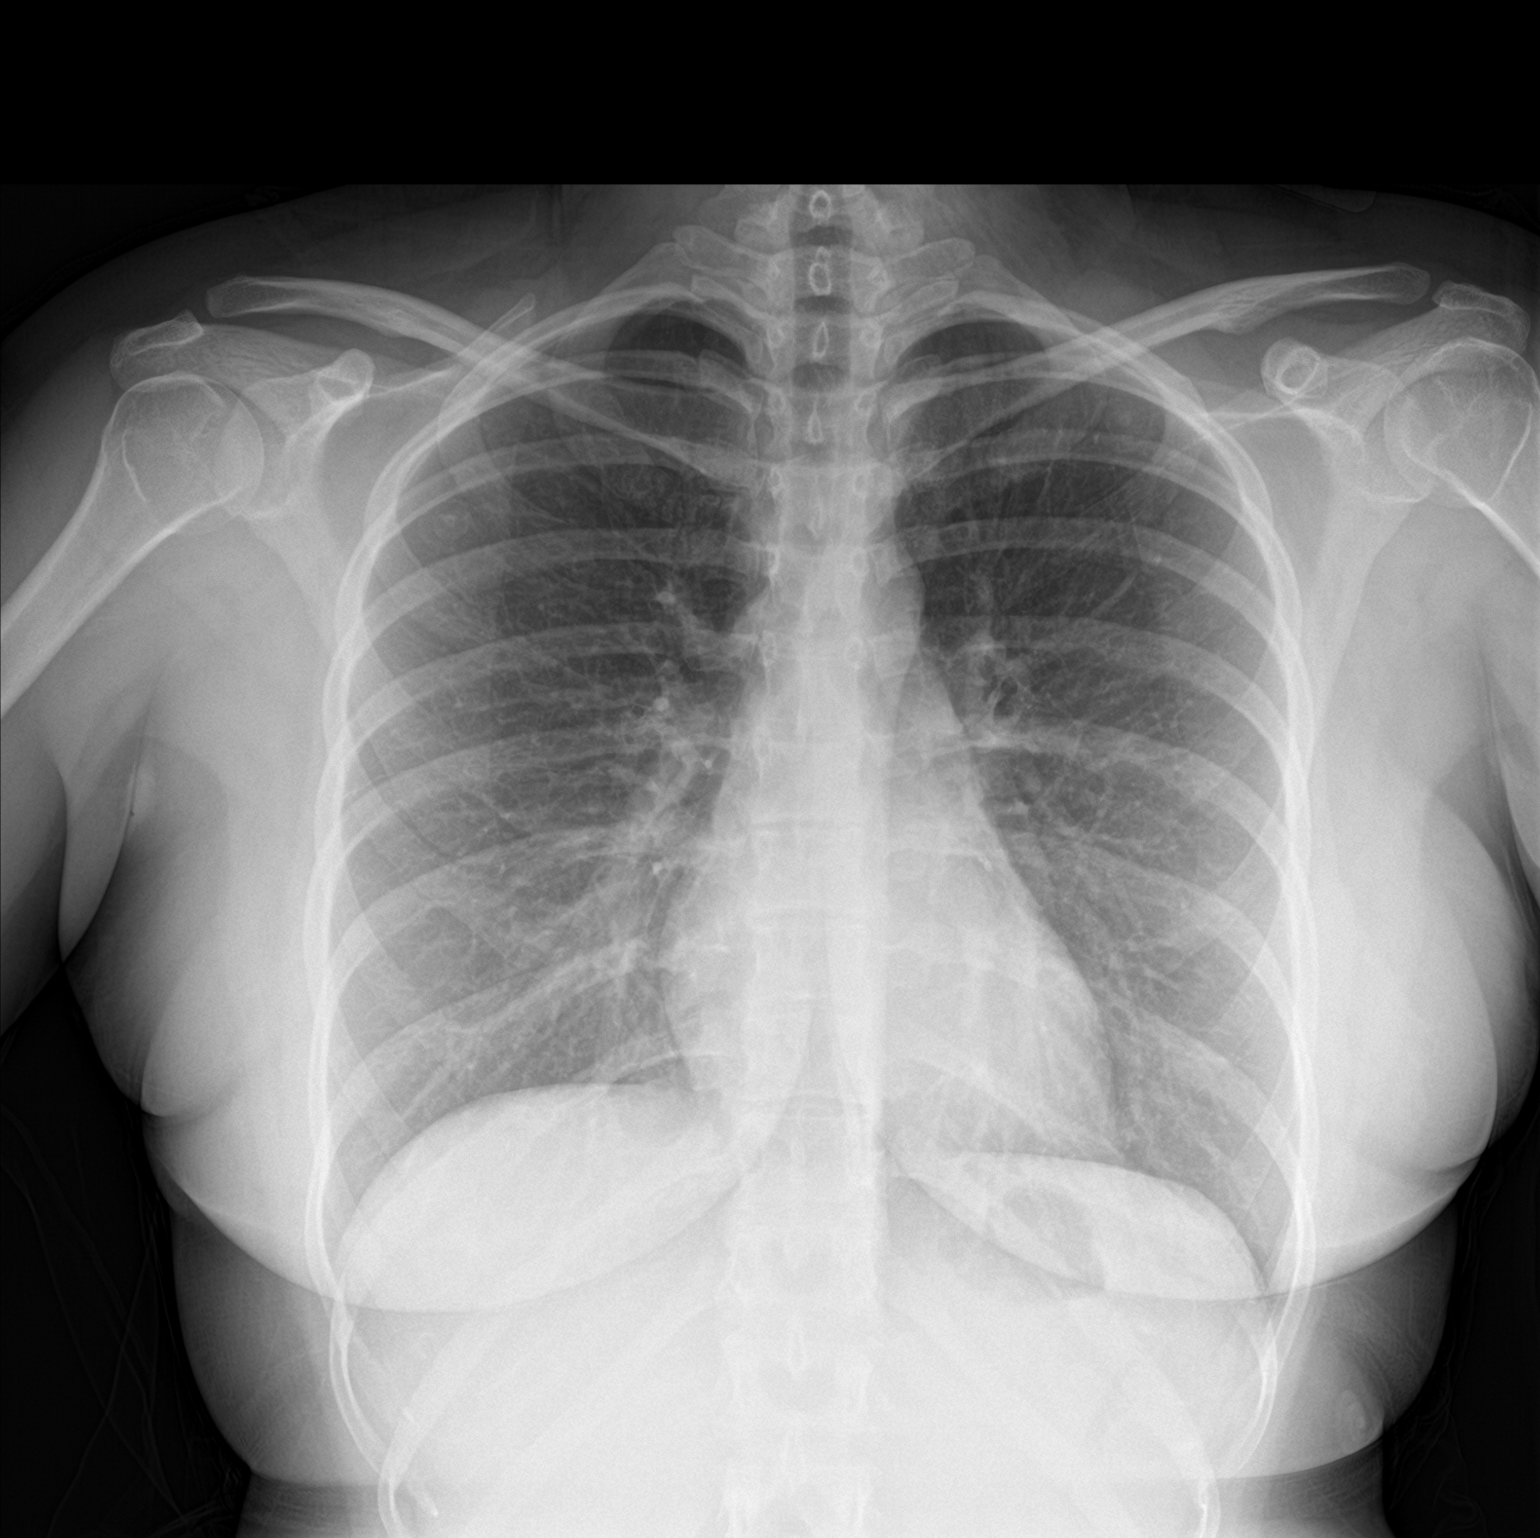

[chest lat]
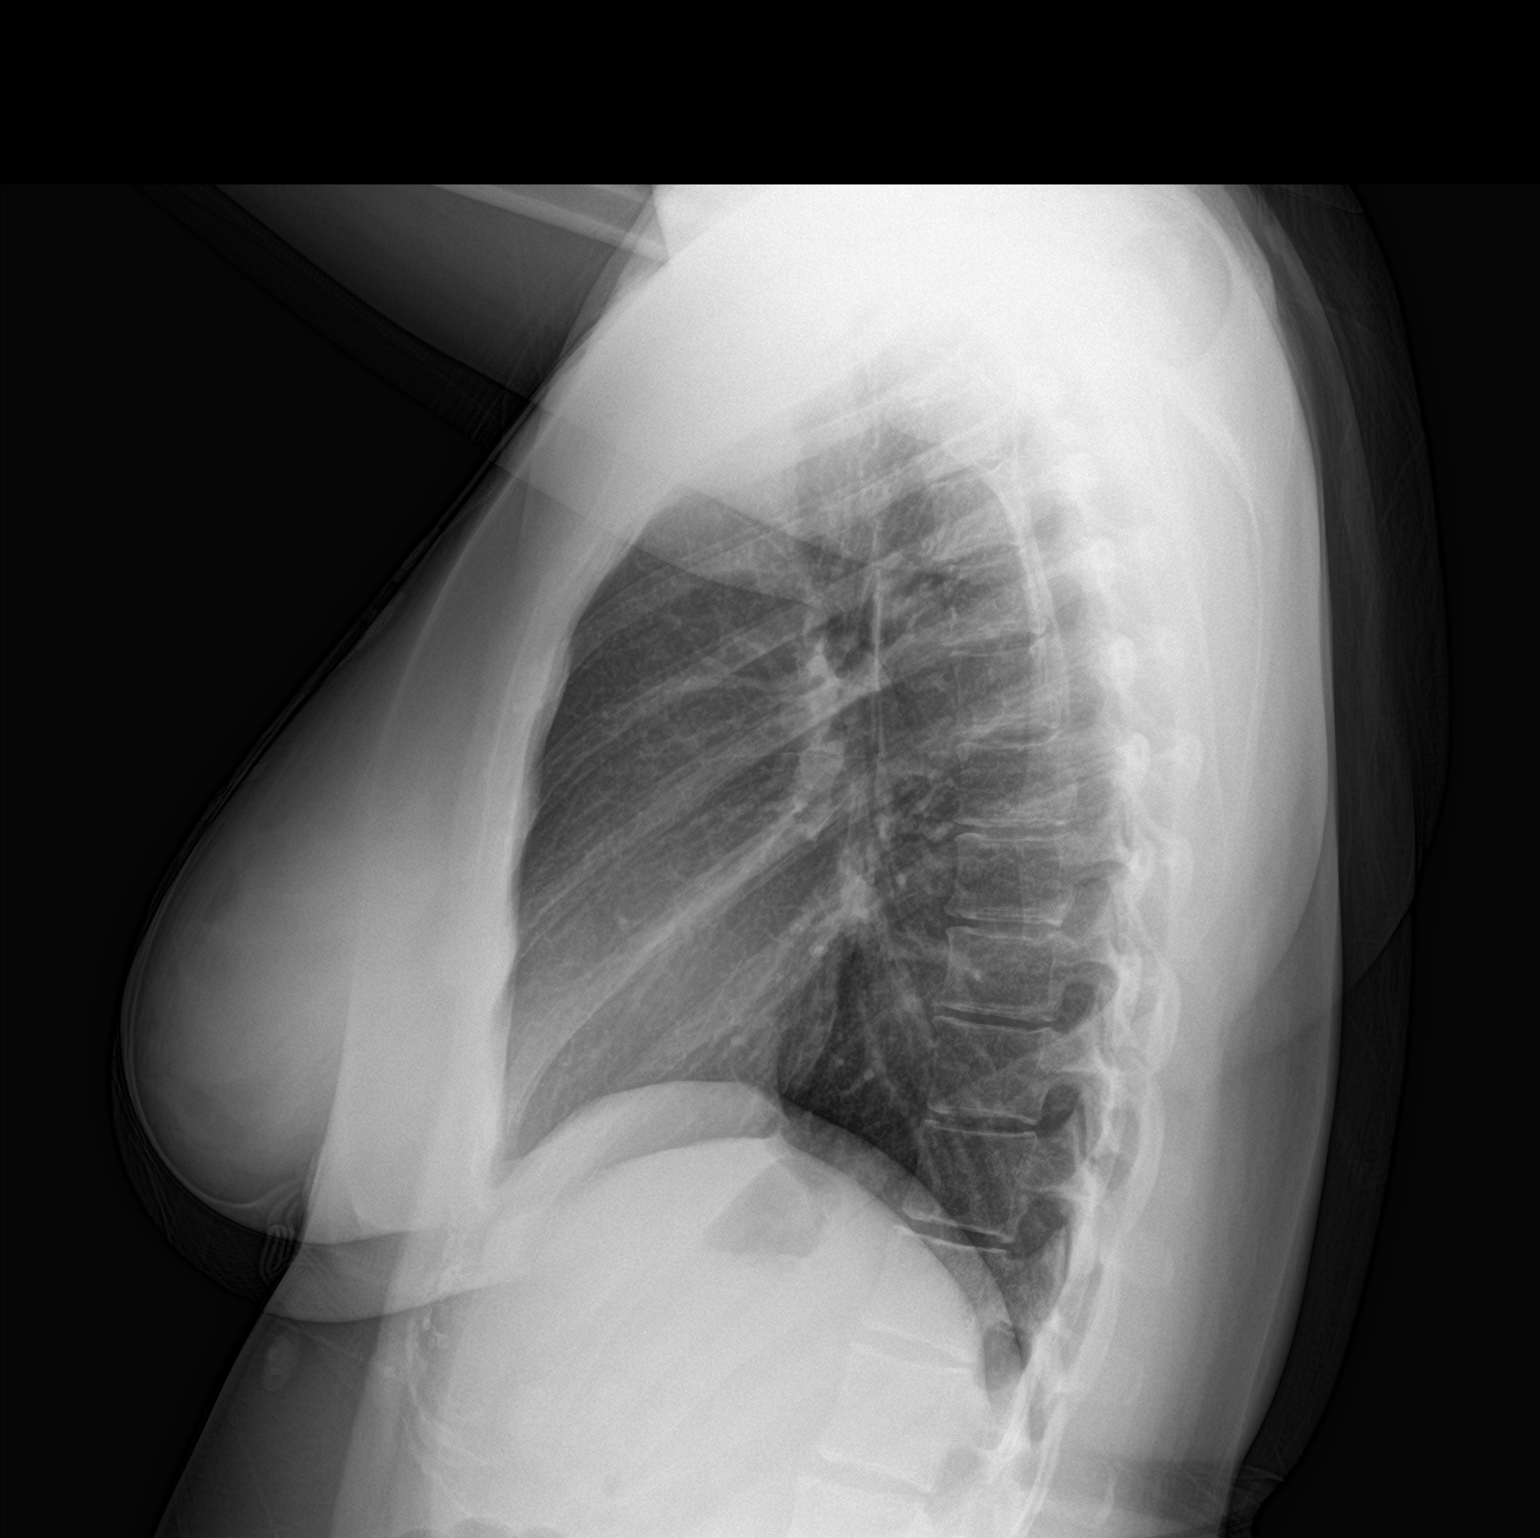

[2 of 2 positions shown; findings below may reference images not displayed]

FINDINGS: The heart size and mediastinal contours are within normal limits.
Both lungs are clear. The visualized skeletal structures are
unremarkable.
IMPRESSION: No active cardiopulmonary disease.

## 2022-11-04 DIAGNOSIS — G4719 Other hypersomnia: Secondary | ICD-10-CM | POA: Diagnosis not present

## 2022-11-04 DIAGNOSIS — F329 Major depressive disorder, single episode, unspecified: Secondary | ICD-10-CM | POA: Diagnosis not present

## 2022-12-09 DIAGNOSIS — Z124 Encounter for screening for malignant neoplasm of cervix: Secondary | ICD-10-CM | POA: Diagnosis not present

## 2022-12-09 DIAGNOSIS — F3181 Bipolar II disorder: Secondary | ICD-10-CM | POA: Diagnosis not present

## 2022-12-09 DIAGNOSIS — Z23 Encounter for immunization: Secondary | ICD-10-CM | POA: Diagnosis not present

## 2022-12-09 DIAGNOSIS — Z0000 Encounter for general adult medical examination without abnormal findings: Secondary | ICD-10-CM | POA: Diagnosis not present

## 2022-12-09 DIAGNOSIS — F411 Generalized anxiety disorder: Secondary | ICD-10-CM | POA: Diagnosis not present

## 2023-02-05 DIAGNOSIS — E559 Vitamin D deficiency, unspecified: Secondary | ICD-10-CM | POA: Diagnosis not present

## 2023-02-05 DIAGNOSIS — Z13220 Encounter for screening for lipoid disorders: Secondary | ICD-10-CM | POA: Diagnosis not present

## 2023-02-05 DIAGNOSIS — G4719 Other hypersomnia: Secondary | ICD-10-CM | POA: Diagnosis not present

## 2023-02-23 DIAGNOSIS — F338 Other recurrent depressive disorders: Secondary | ICD-10-CM | POA: Diagnosis not present

## 2023-02-23 DIAGNOSIS — F411 Generalized anxiety disorder: Secondary | ICD-10-CM | POA: Diagnosis not present

## 2023-03-11 DIAGNOSIS — F411 Generalized anxiety disorder: Secondary | ICD-10-CM | POA: Diagnosis not present

## 2023-03-11 DIAGNOSIS — F338 Other recurrent depressive disorders: Secondary | ICD-10-CM | POA: Diagnosis not present

## 2023-07-20 ENCOUNTER — Ambulatory Visit (INDEPENDENT_AMBULATORY_CARE_PROVIDER_SITE_OTHER): Payer: 59 | Admitting: Plastic Surgery

## 2023-07-20 ENCOUNTER — Encounter: Payer: Self-pay | Admitting: Plastic Surgery

## 2023-07-20 VITALS — BP 118/84 | HR 80 | Ht 64.0 in | Wt 180.0 lb

## 2023-07-20 DIAGNOSIS — L723 Sebaceous cyst: Secondary | ICD-10-CM

## 2023-07-20 NOTE — Progress Notes (Signed)
 Patient ID: Dawn Snyder, female    DOB: Oct 22, 2000, 23 y.o.   MRN: [id number]   Chief Complaint  Patient presents with   Consult         The patient is a 23 year old female here for evaluation of her face.  The patient states for approximately 2 years she has had a lesion on her right cheek.  The patient says it was much larger and she squeezed it.  It drained a bit and now has closed up but it frequently gets larger and smaller and irritated.  Sometimes it gets red and sometimes it drains.  Now it is probably about 3 mm in size.  Does not look to be infected.  She would like to have it removed.    Review of Systems  Constitutional: Negative.   Eyes: Negative.   Respiratory: Negative.    Cardiovascular: Negative.   Gastrointestinal: Negative.   Endocrine: Negative.   Genitourinary: Negative.     Past Medical History:  Diagnosis Date   Asthma    Bipolar disorder (HCC)     Past Surgical History:  Procedure Laterality Date   TYMPANOSTOMY TUBE PLACEMENT     1st grade   WISDOM TOOTH EXTRACTION Bilateral       Current Outpatient Medications:    albuterol (VENTOLIN HFA) 108 (90 Base) MCG/ACT inhaler, Inhale 1-2 puffs into the lungs every 4 (four) hours as needed., Disp: , Rfl:    albuterol (VENTOLIN HFA) 108 (90 Base) MCG/ACT inhaler, Inhale 2 puffs into the lungs every 6 (six) hours as needed for wheezing or shortness of breath., Disp: 8 g, Rfl: 2   ARIPiprazole (ABILIFY) 5 MG tablet, Take 5 mg by mouth daily., Disp: , Rfl:    FLUoxetine (PROZAC) 20 MG capsule, Take 20 mg by mouth daily., Disp: , Rfl:    fluticasone furoate-vilanterol (BREO ELLIPTA) 100-25 MCG/ACT AEPB, Inhale 1 puff into the lungs daily. Use only during asthma flares for 1 to 2 weeks at a time or until symptoms resolve., Disp: 1 each, Rfl: 2   levocetirizine (XYZAL) 5 MG tablet, Take 1 tablet (5 mg total) by mouth every evening., Disp: 30 tablet, Rfl: 5   montelukast (SINGULAIR) 10 MG tablet, Take 1  tablet (10 mg total) by mouth at bedtime., Disp: 30 tablet, Rfl: 5   Norgestimate-Ethinyl Estradiol Triphasic 0.18/0.215/0.25 MG-25 MCG tab, Take 1 tablet by mouth daily., Disp: , Rfl:    Objective:   Vitals:   07/20/23 1410  BP: 118/84  Pulse: 80  SpO2: 98%    Physical Exam Constitutional:      Appearance: Normal appearance.  HENT:     Head: Atraumatic.  Skin:    General: Skin is warm.     Capillary Refill: Capillary refill takes less than 2 seconds.     Coloration: Skin is not jaundiced.     Findings: Lesion present. No bruising.  Neurological:     Mental Status: She is alert and oriented to person, place, and time.  Psychiatric:        Mood and Affect: Mood normal.        Behavior: Behavior normal.        Thought Content: Thought content normal.        Judgment: Judgment normal.     Assessment & Plan:  Sebaceous cyst  Plan for excision of sebaceous cyst in the office.  I have encouraged the patient to increase her protein and decrease her carbs and sugars  so she can heal well after the procedure.  Pictures were obtained of the patient and placed in the chart with the patient's or guardian's permission.Spine Musculoskeletal Exam  General   Neurological: alert    Lindaann Requena Britiny Defrain, DO

## 2023-10-19 ENCOUNTER — Ambulatory Visit: Payer: Self-pay | Admitting: Plastic Surgery

## 2023-10-19 ENCOUNTER — Encounter: Payer: Self-pay | Admitting: Plastic Surgery

## 2023-10-19 DIAGNOSIS — L723 Sebaceous cyst: Secondary | ICD-10-CM

## 2023-10-19 NOTE — Progress Notes (Signed)
 Procedure Note  Preoperative Dx: Sebaceous cyst  Postoperative Dx: Same  Procedure: Excision sebaceous cyst face 3 mm  Anesthesia: Lidocaine 1% with 1:100,000 epinephrine  Indication for Procedure: Sebaceous cyst  Description of Procedure: Risks and complications were explained to the patient.  Consent was confirmed and the patient understands the risks and benefits.  The potential complications and alternatives were explained and the patient consents.  The patient expressed understanding the option of not having the procedure and the risks of a scar.  Time out was called and all information was confirmed to be correct.    The area was prepped and drapped.  Lidocaine 1% with epinephrine was injected in the subcutaneous area.  After waiting several minutes for the local to take affect a #15 blade was used to excise the area. The skin edges were reapproximated with 6-0 Monocryl.  A dressing was applied.  The patient was given instructions on how to care for the area and a follow up appointment.  Shadow tolerated the procedure well and there were no complications. Patient did not want specimen sent to path.

## 2023-10-26 ENCOUNTER — Encounter: Payer: Self-pay | Admitting: Plastic Surgery

## 2023-10-26 ENCOUNTER — Ambulatory Visit (INDEPENDENT_AMBULATORY_CARE_PROVIDER_SITE_OTHER): Payer: Self-pay | Admitting: Plastic Surgery

## 2023-10-26 VITALS — BP 112/83 | HR 86

## 2023-10-26 DIAGNOSIS — L723 Sebaceous cyst: Secondary | ICD-10-CM

## 2023-10-26 NOTE — Progress Notes (Signed)
 The patient is a 23 year old female here for follow-up after undergoing excision of a right cheek cyst.  The area is healing nicely.  I removed the stitch.  Steri-Strip was applied.  Continue with the Steri-Strip for about another week.  Should be able to start the usual skin care regiment after that.  Follow-up as needed.

## 2023-12-29 ENCOUNTER — Ambulatory Visit
Admission: EM | Admit: 2023-12-29 | Discharge: 2023-12-29 | Disposition: A | Discharge status: Home / Self Care | Attending: Internal Medicine | Admitting: Internal Medicine

## 2023-12-29 ENCOUNTER — Other Ambulatory Visit: Payer: Self-pay

## 2023-12-29 DIAGNOSIS — J029 Acute pharyngitis, unspecified: Secondary | ICD-10-CM

## 2023-12-29 DIAGNOSIS — R6883 Chills (without fever): Secondary | ICD-10-CM

## 2023-12-29 DIAGNOSIS — H9203 Otalgia, bilateral: Secondary | ICD-10-CM | POA: Diagnosis not present

## 2023-12-29 LAB — POC COVID19/FLU A&B COMBO
Covid Antigen, POC: NEGATIVE
Influenza A Antigen, POC: NEGATIVE
Influenza B Antigen, POC: NEGATIVE

## 2023-12-29 LAB — POCT RAPID STREP A (OFFICE): Rapid Strep A Screen: NEGATIVE

## 2023-12-29 MED ORDER — LIDOCAINE VISCOUS HCL 2 % MT SOLN: 15.0000 mL | OROMUCOSAL | 0 refills | Status: AC | PRN

## 2023-12-29 MED ORDER — AMOXICILLIN 500 MG PO CAPS: 500.0000 mg | ORAL_CAPSULE | Freq: Two times a day (BID) | ORAL | 0 refills | Status: AC

## 2023-12-29 NOTE — ED Provider Notes (Signed)
 [facility]  Note:  This document was prepared using Dragon voice recognition software and may include unintentional dictation errors.  MRN: [id number] DOB: [birth year] DATE: 12/29/23   Subjective:  Chief Complaint:  Chief Complaint  Patient presents with   Sore Throat     HPI: Dawn Snyder is a 23 y.o. female presenting for sore throat for less than one day. Patient states last night she went to bed and noticed that her throat was slightly sore. She states she woke up this morning and noticed that it was much worse. She states she has had bilateral ear pain as well as chills and myalgias. She reports taking tylenol last night with no relief. No known sick contacts. Denies fever, nausea/vomiting, cough, congestion, abdominal pain. Endorses sore throat, otalgia, headache, myalgias. Presents NAD.  Prior to Admission medications   Medication Sig Start Date End Date Taking? Authorizing Provider  albuterol  (VENTOLIN  HFA) 108 (90 Base) MCG/ACT inhaler Inhale 1-2 puffs into the lungs every 4 (four) hours as needed. 01/03/08   [provider]  albuterol  (VENTOLIN  HFA) 108 (90 Base) MCG/ACT inhaler Inhale 2 puffs into the lungs every 6 (six) hours as needed for wheezing or shortness of breath. 09/12/21   Marinda Rocky SAILOR, MD  desvenlafaxine (PRISTIQ) 25 MG 24 hr tablet Take 75 mg by mouth every morning. 09/07/23   [provider]  hydrOXYzine (VISTARIL) 25 MG capsule Take 25-50 mg by mouth daily. 09/07/23   [provider]  levocetirizine (XYZAL ) 5 MG tablet Take 1 tablet (5 mg total) by mouth every evening. 09/25/21   Ambs, Arlean HERO, FNP  montelukast  (SINGULAIR ) 10 MG tablet Take 1 tablet (10 mg total) by mouth at bedtime. 12/04/21   Cari Arlean HERO, FNP  prazosin (MINIPRESS) 1 MG capsule Take 1-2 mg by mouth at bedtime. 09/07/23   [provider]     Allergies  Allergen Reactions   Lactose Intolerance (Gi) Other (See Comments)   Prednisone Other (See Comments)     Chest pain   Soap & Cleansers Itching    History:   Past Medical History:  Diagnosis Date   Asthma    Bipolar disorder (HCC)      Past Surgical History:  Procedure Laterality Date   TYMPANOSTOMY TUBE PLACEMENT     1st grade   WISDOM TOOTH EXTRACTION Bilateral     Family History  Problem Relation Age of Onset   Allergic rhinitis Mother    Allergic rhinitis Father    Hyperlipidemia Father    Asthma Father    Asthma Brother    Hypertension Paternal Grandmother    Fainting Paternal Grandmother    Heart disease Paternal Grandmother    Heart attack Paternal Grandmother     Social History   Tobacco Use   Smoking status: Never    Passive exposure: Never   Smokeless tobacco: Never  Vaping Use   Vaping status: Never Used  Substance Use Topics   Alcohol use: Never   Drug use: Never    Review of Systems  Constitutional:  Positive for chills and fatigue. Negative for fever.  HENT:  Positive for ear pain and sore throat. Negative for congestion and ear discharge.   Respiratory:  Negative for cough.   Gastrointestinal:  Negative for abdominal pain, nausea and vomiting.  Musculoskeletal:  Positive for myalgias.  Neurological:  Positive for headaches.     Objective:   Vitals: BP 128/84 (BP Location: Right Arm)   Pulse (!) 114  Temp 99.2 F (37.3 C) (Oral)   Resp 18   LMP 12/10/2023   SpO2 98%   Physical Exam Constitutional:      General: She is not in acute distress.    Appearance: Normal appearance. She is well-developed and overweight. She is not ill-appearing or toxic-appearing.  HENT:     Head: Normocephalic and atraumatic.     Right Ear: Ear canal normal. A middle ear effusion is present.     Left Ear: Tympanic membrane and ear canal normal.     Ears:     Comments: Right TM with opacified effusion noted on exam.     Mouth/Throat:     Pharynx: Uvula midline. Pharyngeal swelling, oropharyngeal exudate and posterior oropharyngeal erythema present.      Tonsils: Tonsillar exudate present. No tonsillar abscesses.  Cardiovascular:     Rate and Rhythm: Regular rhythm. Tachycardia present.     Heart sounds: Normal heart sounds.  Pulmonary:     Effort: Pulmonary effort is normal.     Breath sounds: Normal breath sounds.     Comments: Clear to auscultation bilaterally  Abdominal:     General: Bowel sounds are normal.     Palpations: Abdomen is soft.     Tenderness: There is no abdominal tenderness.  Lymphadenopathy:     Cervical: Cervical adenopathy present.     Right cervical: Superficial cervical adenopathy present.     Left cervical: Superficial cervical adenopathy present.  Skin:    General: Skin is warm and dry.  Neurological:     General: No focal deficit present.     Mental Status: She is alert.     GCS: GCS eye subscore is 4. GCS verbal subscore is 5. GCS motor subscore is 6.  Psychiatric:        Mood and Affect: Mood and affect normal.     Results:  Labs: Results for orders placed or performed during the hospital encounter of 12/29/23 (from the past 24 hours)  POCT rapid strep A     Status: None   Collection Time: 12/29/23  9:46 AM  Result Value Ref Range   Rapid Strep A Screen Negative Negative  POC Covid19/Flu A&B Antigen     Status: None   Collection Time: 12/29/23  9:46 AM  Result Value Ref Range   Influenza A Antigen, POC Negative Negative   Influenza B Antigen, POC Negative Negative   Covid Antigen, POC Negative Negative    Radiology: No results found.   UC Course/Treatments:  Procedures: Procedures   Medications Ordered in UC: Medications - No data to display   Assessment and Plan :     ICD-10-CM   1. Exudative pharyngitis  J02.9 POCT rapid strep A    POCT rapid strep A    2. Acute otalgia, bilateral  H92.03     3. Chills  R68.83 POC Covid19/Flu A&B Antigen    POC Covid19/Flu A&B Antigen     Exudative pharyngitis Afebrile, nontoxic-appearing, NAD. VSS. DDX includes but not limited to:  strep, mono, viral pharyngitis, post nasal drip Strep was negative. Throat culture pending. Given extent of tonsillitis and exudates, Amoxicillin  500mg  BID was prescribed. Viscous lidocaine  15mLs every 3 hours PRN was prescribed for sore throat. Recommend OTC analgesics as needed for pain. Strict ED precautions were given and patient verbalized understanding.  Acute Otalgia, Bilateral Afebrile, nontoxic-appearing, NAD. VSS. DDX includes but not limited to: otitis media, otitis externa, eustachian tube dysfunction, cerumen impaction Right TM with discolored fluid  noted on exam. Concern for early onset otitis media. Amoxicillin  for exudative pharyngitis will cover as well. Recommend OTC analgesics as needed for pain. Strict ED precautions were given and patient verbalized understanding.  Chills Afebrile, nontoxic-appearing, NAD. VSS. DDX includes but not limited to: COVID, flu, bronchitis, pneumonia, viral URI COVID and flu were negative today in office. Strict ED precautions were given and patient verbalized understanding.  ED Discharge Orders          Ordered    amoxicillin  (AMOXIL ) 500 MG capsule  2 times daily        12/29/23 1005    lidocaine  (XYLOCAINE ) 2 % solution  Every  3 hours PRN        12/29/23 1005             PDMP not reviewed this encounter.     Keyanah Kozicki P, PA-C 12/29/23 1007

## 2023-12-29 NOTE — ED Triage Notes (Signed)
 Patient C/O severe sore throat, headache, and bilateral ear pain. Patient C/O chills and body aches. Patient states onset last night. Patient is taking tylenol, last dose was last night.

## 2023-12-29 NOTE — Discharge Instructions (Signed)
 Your strep, COVID, and flu tests were negative. A throat culture was sent to the lab for further testing.  You will be called with the results of your culture. Ibuprofen/Tylenol as needed for pain if you are not allergic. I have also sent Amoxicillin  to your pharmacy. This is an antibiotic. Please take as directed. I have also sent numbing medication for your throat.   Return in 2 to 3 days if no improvement. Please go directly to the Emergency Department immediately should you begin to have any of the following symptoms: increased pain, persistent fevers, difficulty swallowing, difficulty talking, drooling or difficulty breathing.
